# Patient Record
Sex: Male | Born: 1985 | Race: White | Hispanic: No | Marital: Single | Smoking: Former smoker
Health system: Southern US, Community
[De-identification: ages and names within clinical notes are randomized; demographics above are authoritative.]

## PROBLEM LIST (undated history)

## (undated) DIAGNOSIS — I370 Nonrheumatic pulmonary valve stenosis: Secondary | ICD-10-CM

## (undated) HISTORY — PX: WRIST SURGERY: SHX841

---

## 2007-04-11 ENCOUNTER — Emergency Department (HOSPITAL_COMMUNITY): Admission: EM | Admit: 2007-04-11 | Discharge: 2007-04-11 | Payer: Self-pay | Admitting: Emergency Medicine

## 2007-04-12 ENCOUNTER — Ambulatory Visit (HOSPITAL_COMMUNITY): Admission: RE | Admit: 2007-04-12 | Discharge: 2007-04-12 | Payer: Self-pay | Admitting: Orthopedic Surgery

## 2009-12-05 ENCOUNTER — Emergency Department (HOSPITAL_COMMUNITY): Admission: EM | Admit: 2009-12-05 | Discharge: 2009-12-05 | Payer: Self-pay | Admitting: Family Medicine

## 2010-12-17 NOTE — Consult Note (Signed)
NAME:  ASENCION, LOVEDAY NO.:  [id number]   MEDICAL RECORD NO.:  [id number]          PATIENT TYPE:  EMS   LOCATION:  ED                           FACILITY:  [facility]   PHYSICIAN:  Artist Pais. Weingold, M.D.DATE OF BIRTH:  Dec 04, 1985   DATE OF CONSULTATION:  04/11/2007  DATE OF DISCHARGE:                                 CONSULTATION   REASON FOR CONSULTATION:  Mr. Loken is a 25 year old right hand dominant  male who works at [employer]. Was getting off at work, riding his bike,  and feel onto his outstretched non-dominant left wrist. Presents today  with pain, swelling, and deformity. He is 25 years old. He is right hand  dominant.   ALLERGIES:  NO KNOWN DRUG ALLERGIES.   MEDICATIONS:  He is taking Adoral 30 mg daily.   PAST MEDICAL HISTORY:  Pulmonary hypertension, which he takes antibiotic  prophylaxis for dental procedures, which is stable. No other past  medical or surgical history.   SOCIAL HISTORY:  Noncontributory.   PHYSICAL EXAMINATION:  GENERAL:  A well nourished male. Pleasant, alert.  EXTREMITIES:  He is swollen and tender over his left wrist. He has a  minor abrasion on the ulnar side. He has no other significant finding of  the right or left upper extremities.   LABORATORY DATA:  X-rays shows a trans-radial styloid, perilunate  dislocation left wrist.   EMERGENCY DEPARTMENT COURSE:  He was given a 2% plain Lidocaine hematoma  block. He was then placed in finger-trap traction 15 pounds. Closed  reduction was performed. Portable  x-ray films showed reduction. He was placed in a well padded Sugar-tong  splint.   DISPOSITION:  He was discharged with Percocet for pain and instructions  on compartment syndrome. He is to call my office Monday morning. He is  to be NPO. We will try to fit him on the schedule either Monday or  possibly Wednesday or Friday this week for internal fixation. I  explained to him in great detail, the nature of the perilunate  dislocation. He will need fixation of his distal radius fracture as well  as repair of the ligaments  volarly and dorsally, as well as pinning of  the scapholunate and __________ ligament. Again, he was discharged with  Percocet for pain and he will call my office Monday morning to set up  surgery for that day or earlier this week.      Artist Pais Mina Marble, M.D.  Electronically Signed     MAW/MEDQ  D:  04/11/2007  T:  04/11/2007  Job:  [id number]

## 2010-12-17 NOTE — Op Note (Signed)
NAME:  Gary Gonzales, Gary Gonzales                ACCOUNT NO.:  [id number]   MEDICAL RECORD NO.:  [id number]          PATIENT TYPE:  AMB   LOCATION:  SDS                          FACILITY:  [facility]   PHYSICIAN:  Artist Pais. Weingold, M.D.DATE OF BIRTH:  1986-05-10   DATE OF PROCEDURE:  04/12/2007  DATE OF DISCHARGE:  04/12/2007                               OPERATIVE REPORT   PREOPERATIVE DIAGNOSIS:  Trans-styloid perilunate fracture dislocation,  left wrist.   POSTOPERATIVE DIAGNOSIS:  Trans-styloid perilunate fracture dislocation,  left wrist.   PROCEDURE:  Open reduction internal fixation distal radius fracture and  fixation of perilunate dislocation to both dorsal and volar approaches  with pinning of scapholunate and lunotriquetral ligaments, direct repair  of scapholunate interosseous ligament.   SURGEON:  Artist Pais. Mina Marble, M.D.   ASSISTANT:  None.   ANESTHESIA:  General.   TOURNIQUET TIME:  2 hours and 1 minute.   No complication.  No drains.   OPERATIVE REPORT:  The patient was taken to operating suite after the  induction of adequate general anesthesia.  The left upper extremity was  prepped and draped in a sterile fashion.  Esmarch was used to  exsanguinate the limb.  Tourniquet was inflated to 250 mmHg.  At this  point in time, an incision was made over the palmar aspect of the left  hand through the thenar crease in Brunner fashion across the wrist, out  the proximal forearm, over the flexor carpi radialis tendon.  Skin was  incised.  Flaps were raised accordingly.  The median nerve was  identified proximal to the carpal tunnel.  The carpal tunnel was  released.  There was copious amounts of blood in the carpal tunnel.  The  contents of the carpal tunnel were carefully retracted.  Once this was  done, a large transverse rent in the volar capsule was encountered.  The  lunate had been dislocated.  The lunate was in its proper position.  The  wound was then irrigated.  The rent  in the volar capsule was repaired  with 2-0 FiberWire suture in 6 separate baseball-type stitch sutures.  Once this was done, the interval between the flexor carpi radialis and  radial artery was developed.  Dissection was carried down to the  pronator quadratus.  This was carefully subperiosteally stripped off  distal radius.  Reduction was performed with a Therapist, nutritional, carefully  elevating a depressed articular fragment.  It was then fixed with DVR  left short standard plate using standard techniques.  After this was  completed, intraoperative fluoroscopy revealed adequate reduction in  both the AP, lateral and oblique view.  The wounds were irrigated and  packed with saline-soaked 4x4s.  The hand was then fully pronated, a  midline incision was made dorsally, the fourth dorsal compartment was  carefully identified and opened, retracted.  There was a large rent in  the dorsal capsule overlying the scapholunate interosseous ligament.  There was a direct avulsion of the scapholunate ligament off of the  lunate,  still attached to the scaphoid.  This was also irrigated of  clot.  After this was done under fluoroscopic and direct vision, the  scapholunate and lunotriquetral ligaments were pinned using of 045 K-  wires x2.  The K-wires were placed percutaneously and again driven from  the scaphoid into lunate and triquetrum into lunate.  Intraoperative  fluoroscopy revealed near anatomic alignment in both the AP, lateral and  oblique views.  The K-wires cut outside the skin and bent upon  themselves.  After this was completed, the scapholunate ligament  repaired directly using 2-0 FiberWire followed by a capsular imbrication  dorsally using the 2-0 FiberWire as well.  After this was completed, the  wound was irrigated.  The dorsal wound was closed in layers of a 3-0  undyed Vicryl for repair of the retinaculum and 4-0 Vicryl Rapide on the  skin.  The palmar wounds were repaired using the  2-0 undyed Vicryl to  repair the pronator quadratus, followed by a 4-0 Vicryl Rapide on the  skin.  Xeroform, 4x4s fluffs and a sugar-tong splint were applied.  The  patient also had 0.25% plain Marcaine injected in the wound for  postoperative pain control.  The patient tolerated the procedure well  and went to recovery room in stable fashion.      Artist Pais Mina Marble, M.D.  Electronically Signed     MAW/MEDQ  D:  04/12/2007  T:  04/13/2007  Job:  [id number]

## 2011-05-16 LAB — CBC
HCT: 43
Hemoglobin: 14.9
MCHC: 34.8
MCV: 89.3
Platelets: 256
RBC: 4.82
RDW: 12.6
WBC: 9.2

## 2013-09-25 ENCOUNTER — Emergency Department (INDEPENDENT_AMBULATORY_CARE_PROVIDER_SITE_OTHER)
Admission: EM | Admit: 2013-09-25 | Discharge: 2013-09-25 | Disposition: A | Discharge status: Home / Self Care | Payer: Self-pay | Source: Home / Self Care | Attending: Family Medicine | Admitting: Family Medicine

## 2013-09-25 ENCOUNTER — Encounter (HOSPITAL_COMMUNITY): Payer: Self-pay | Admitting: Emergency Medicine

## 2013-09-25 DIAGNOSIS — J329 Chronic sinusitis, unspecified: Secondary | ICD-10-CM

## 2013-09-25 DIAGNOSIS — J029 Acute pharyngitis, unspecified: Secondary | ICD-10-CM

## 2013-09-25 MED ORDER — AMOXICILLIN-POT CLAVULANATE 875-125 MG PO TABS: 1.0000 | ORAL_TABLET | Freq: Two times a day (BID) | ORAL | Status: DC

## 2013-09-25 MED ORDER — IPRATROPIUM BROMIDE 0.06 % NA SOLN
2.0000 | Freq: Four times a day (QID) | NASAL | Status: DC
Start: 2013-09-25 — End: 2021-12-02

## 2013-09-25 MED ORDER — PREDNISONE 20 MG PO TABS: 40.0000 mg | ORAL_TABLET | Freq: Every day | ORAL | Status: DC

## 2013-09-25 NOTE — ED Notes (Signed)
C/o sinus pressure and pain.  Nasal congestion.  Runny nose.   Sore throat.  Denies fever and any other symptoms.  Symptoms present since yesterday.

## 2013-09-25 NOTE — Discharge Instructions (Signed)
Thank you for coming in today. Take Augmentin and prednisone. Use Atrovent nasal spray. Is up to 2 Aleve twice daily for pain. Followup with your primary care provider as needed. Call or go to the emergency room if you get worse, have trouble breathing, have chest pains, or palpitations.     Pharyngitis Pharyngitis is redness, pain, and swelling (inflammation) of your pharynx.  CAUSES  Pharyngitis is usually caused by infection. Most of the time, these infections are from viruses (viral) and are part of a cold. However, sometimes pharyngitis is caused by bacteria (bacterial). Pharyngitis can also be caused by allergies. Viral pharyngitis may be spread from person to person by coughing, sneezing, and personal items or utensils (cups, forks, spoons, toothbrushes). Bacterial pharyngitis may be spread from person to person by more intimate contact, such as kissing.  SIGNS AND SYMPTOMS  Symptoms of pharyngitis include:   Sore throat.   Tiredness (fatigue).   Low-grade fever.   Headache.  Joint pain and muscle aches.  Skin rashes.  Swollen lymph nodes.  Plaque-like film on throat or tonsils (often seen with bacterial pharyngitis). DIAGNOSIS  Your health care provider will ask you questions about your illness and your symptoms. Your medical history, along with a physical exam, is often all that is needed to diagnose pharyngitis. Sometimes, a rapid strep test is done. Other lab tests may also be done, depending on the suspected cause.  TREATMENT  Viral pharyngitis will usually get better in 3 4 days without the use of medicine. Bacterial pharyngitis is treated with medicines that kill germs (antibiotics).  HOME CARE INSTRUCTIONS   Drink enough water and fluids to keep your urine clear or pale yellow.   Only take over-the-counter or prescription medicines as directed by your health care provider:   If you are prescribed antibiotics, make sure you finish them even if you start to  feel better.   Do not take aspirin.   Get lots of rest.   Gargle with 8 oz of salt water ( tsp of salt per 1 qt of water) as often as every 1 2 hours to soothe your throat.   Throat lozenges (if you are not at risk for choking) or sprays may be used to soothe your throat. SEEK MEDICAL CARE IF:   You have large, tender lumps in your neck.  You have a rash.  You cough up green, yellow-brown, or bloody spit. SEEK IMMEDIATE MEDICAL CARE IF:   Your neck becomes stiff.  You drool or are unable to swallow liquids.  You vomit or are unable to keep medicines or liquids down.  You have severe pain that does not go away with the use of recommended medicines.  You have trouble breathing (not caused by a stuffy nose). MAKE SURE YOU:   Understand these instructions.  Will watch your condition.  Will get help right away if you are not doing well or get worse. Document Released: 07/21/2005 Document Revised: 05/11/2013 Document Reviewed: 03/28/2013 [facility] Patient Information 2014 [place], [place].   Sinusitis Sinusitis is redness, soreness, and swelling (inflammation) of the paranasal sinuses. Paranasal sinuses are air pockets within the bones of your face (beneath the eyes, the middle of the forehead, or above the eyes). In healthy paranasal sinuses, mucus is able to drain out, and air is able to circulate through them by way of your nose. However, when your paranasal sinuses are inflamed, mucus and air can become trapped. This can allow bacteria and other germs to grow and  cause infection. Sinusitis can develop quickly and last only a short time (acute) or continue over a long period (chronic). Sinusitis that lasts for more than 12 weeks is considered chronic.  CAUSES  Causes of sinusitis include:  Allergies.  Structural abnormalities, such as displacement of the cartilage that separates your nostrils (deviated septum), which can decrease the air flow through your nose and  sinuses and affect sinus drainage.  Functional abnormalities, such as when the small hairs (cilia) that line your sinuses and help remove mucus do not work properly or are not present. SYMPTOMS  Symptoms of acute and chronic sinusitis are the same. The primary symptoms are pain and pressure around the affected sinuses. Other symptoms include:  Upper toothache.  Earache.  Headache.  Bad breath.  Decreased sense of smell and taste.  A cough, which worsens when you are lying flat.  Fatigue.  Fever.  Thick drainage from your nose, which often is green and may contain pus (purulent).  Swelling and warmth over the affected sinuses. DIAGNOSIS  Your caregiver will perform a physical exam. During the exam, your caregiver may:  Look in your nose for signs of abnormal growths in your nostrils (nasal polyps).  Tap over the affected sinus to check for signs of infection.  View the inside of your sinuses (endoscopy) with a special imaging device with a light attached (endoscope), which is inserted into your sinuses. If your caregiver suspects that you have chronic sinusitis, one or more of the following tests may be recommended:  Allergy tests.  Nasal culture A sample of mucus is taken from your nose and sent to a lab and screened for bacteria.  Nasal cytology A sample of mucus is taken from your nose and examined by your caregiver to determine if your sinusitis is related to an allergy. TREATMENT  Most cases of acute sinusitis are related to a viral infection and will resolve on their own within 10 days. Sometimes medicines are prescribed to help relieve symptoms (pain medicine, decongestants, nasal steroid sprays, or saline sprays).  However, for sinusitis related to a bacterial infection, your caregiver will prescribe antibiotic medicines. These are medicines that will help kill the bacteria causing the infection.  Rarely, sinusitis is caused by a fungal infection. In theses cases,  your caregiver will prescribe antifungal medicine. For some cases of chronic sinusitis, surgery is needed. Generally, these are cases in which sinusitis recurs more than 3 times per year, despite other treatments. HOME CARE INSTRUCTIONS   Drink plenty of water. Water helps thin the mucus so your sinuses can drain more easily.  Use a humidifier.  Inhale steam 3 to 4 times a day (for example, sit in the bathroom with the shower running).  Apply a warm, moist washcloth to your face 3 to 4 times a day, or as directed by your caregiver.  Use saline nasal sprays to help moisten and clean your sinuses.  Take over-the-counter or prescription medicines for pain, discomfort, or fever only as directed by your caregiver. SEEK IMMEDIATE MEDICAL CARE IF:  You have increasing pain or severe headaches.  You have nausea, vomiting, or drowsiness.  You have swelling around your face.  You have vision problems.  You have a stiff neck.  You have difficulty breathing. MAKE SURE YOU:   Understand these instructions.  Will watch your condition.  Will get help right away if you are not doing well or get worse. Document Released: 07/21/2005 Document Revised: 10/13/2011 Document Reviewed: 08/05/2011 [employer] Patient Information  2014 [employer]. ° °

## 2013-09-25 NOTE — ED Provider Notes (Signed)
Gary Gonzales is a 28 y.o. male who presents to Urgent Care today for sinus congestion and sore throat present for the last 2 days. Patient notes significant nasal discharge. He also notes significant congestion. He denies any fevers or chills nausea vomiting or diarrhea. He denies trouble breathing. He's tried some Mucinex which helped a little   History reviewed. No pertinent past medical history. History  Substance Use Topics  . Smoking status: Current Some Day Smoker  . Smokeless tobacco: Not on file  . Alcohol Use: No   ROS as above Medications: No current facility-administered medications for this encounter.   Current Outpatient Prescriptions  Medication Sig Dispense Refill  . amoxicillin-clavulanate (AUGMENTIN) 875-125 MG per tablet Take 1 tablet by mouth every 12 (twelve) hours.  14 tablet  0  . ipratropium (ATROVENT) 0.06 % nasal spray Place 2 sprays into both nostrils 4 (four) times daily.  15 mL  1  . predniSONE (DELTASONE) 20 MG tablet Take 2 tablets (40 mg total) by mouth daily.  10 tablet  0    Exam:  BP 142/82  Pulse 63  Temp(Src) 98.1 F (36.7 C) (Oral)  Resp 16  SpO2 97% Gen: Well NAD HEENT: EOMI,  MMM red nose with significant congestion and yellowish discharge. Tender palpation mastoid sinuses bilaterally. Posterior pharynx with cobblestoning and tympanic membranes are normal bilaterally. Lungs: Normal work of breathing. CTABL Heart: RRR no MRG Abd: NABS, Soft. NT, ND Exts: Brisk capillary refill, warm and well perfused.    Assessment and Plan: 28 y.o. male with sinusitis and pharyngitis. Plan to treat with Augmentin and prednisone and Atrovent nasal spray. Continue over-the-counter NSAIDs as needed for pain. Work note provided.  Discussed warning signs or symptoms. Please see discharge instructions. Patient expresses understanding.    Gary BongEvan S Keandrea Tapley, MD 09/25/13 [phone]

## 2018-10-10 ENCOUNTER — Emergency Department (HOSPITAL_COMMUNITY)
Admission: EM | Admit: 2018-10-10 | Discharge: 2018-10-10 | Disposition: A | Discharge status: Home / Self Care | Payer: BLUE CROSS/BLUE SHIELD | Attending: Emergency Medicine | Admitting: Emergency Medicine

## 2018-10-10 ENCOUNTER — Encounter (HOSPITAL_COMMUNITY): Payer: Self-pay | Admitting: Emergency Medicine

## 2018-10-10 DIAGNOSIS — F17200 Nicotine dependence, unspecified, uncomplicated: Secondary | ICD-10-CM | POA: Diagnosis not present

## 2018-10-10 DIAGNOSIS — R112 Nausea with vomiting, unspecified: Secondary | ICD-10-CM | POA: Diagnosis not present

## 2018-10-10 LAB — COMPREHENSIVE METABOLIC PANEL
ALT: 28 U/L (ref 0–44)
AST: 30 U/L (ref 15–41)
Albumin: 4.7 g/dL (ref 3.5–5.0)
Alkaline Phosphatase: 42 U/L (ref 38–126)
Anion gap: 12 (ref 5–15)
BUN: 21 mg/dL — AB (ref 6–20)
CALCIUM: 9.8 mg/dL (ref 8.9–10.3)
CHLORIDE: 105 mmol/L (ref 98–111)
CO2: 21 mmol/L — AB (ref 22–32)
CREATININE: 1.12 mg/dL (ref 0.61–1.24)
GFR calc Af Amer: >60 mL/min (ref >60)
GFR calc non Af Amer: >60 mL/min (ref >60)
GLUCOSE: 138 mg/dL — AB (ref 70–99)
Potassium: 3.5 mmol/L (ref 3.5–5.1)
SODIUM: 138 mmol/L (ref 135–145)
Total Bilirubin: 1.2 mg/dL (ref 0.3–1.2)
Total Protein: 7.3 g/dL (ref 6.5–8.1)

## 2018-10-10 LAB — URINALYSIS, ROUTINE W REFLEX MICROSCOPIC
BACTERIA UA: NONE SEEN
Bilirubin Urine: NEGATIVE
Glucose, UA: NEGATIVE mg/dL
Hgb urine dipstick: NEGATIVE
KETONES UR: 80 mg/dL — AB
LEUKOCYTE UA: NEGATIVE
Nitrite: NEGATIVE
PH: 6 (ref 5.0–8.0)
Protein, ur: 30 mg/dL — AB
Specific Gravity, Urine: 1.035 — ABNORMAL HIGH (ref 1.005–1.030)

## 2018-10-10 LAB — CBC
HEMATOCRIT: 45.8 % (ref 39.0–52.0)
Hemoglobin: 15.8 g/dL (ref 13.0–17.0)
MCH: 29.8 pg (ref 26.0–34.0)
MCHC: 34.5 g/dL (ref 30.0–36.0)
MCV: 86.4 fL (ref 80.0–100.0)
PLATELETS: 316 10*3/uL (ref 150–400)
RBC: 5.3 MIL/uL (ref 4.22–5.81)
RDW: 12 % (ref 11.5–15.5)
WBC: 17.9 10*3/uL — ABNORMAL HIGH (ref 4.0–10.5)
nRBC: 0 % (ref 0.0–0.2)

## 2018-10-10 LAB — LIPASE, BLOOD: LIPASE: 34 U/L (ref 11–51)

## 2018-10-10 MED ORDER — HALOPERIDOL LACTATE 5 MG/ML IJ SOLN
2.0000 mg | Freq: Once | INTRAMUSCULAR | Status: AC
  Administered 2018-10-10: 2 mg via INTRAVENOUS
  Filled 2018-10-10: qty 1

## 2018-10-10 MED ORDER — DICYCLOMINE HCL 10 MG/ML IM SOLN
20.0000 mg | Freq: Once | INTRAMUSCULAR | Status: AC
  Administered 2018-10-10: 20 mg via INTRAMUSCULAR
  Filled 2018-10-10: qty 2

## 2018-10-10 MED ORDER — SODIUM CHLORIDE 0.9 % IV BOLUS
1000.0000 mL | Freq: Once | INTRAVENOUS | Status: AC
  Administered 2018-10-10: 1000 mL via INTRAVENOUS

## 2018-10-10 MED ORDER — SODIUM CHLORIDE 0.9% FLUSH
3.0000 mL | Freq: Once | INTRAVENOUS | Status: AC
  Administered 2018-10-10: 3 mL via INTRAVENOUS

## 2018-10-10 NOTE — ED Provider Notes (Signed)
[facility] EMERGENCY DEPARTMENT Provider Note   CSN: [id number] Arrival date & time: 10/10/18  0941    History   Chief Complaint Chief Complaint  Patient presents with  . Emesis    HPI Gary Gonzales is a 33 y.o. male.     The history is provided by the patient and medical records. No language interpreter was used.  Emesis  Associated symptoms: diarrhea   Associated symptoms: no abdominal pain    Gary Gonzales is a 33 y.o. male with no known PMH who presents to the Emergency Department complaining of nausea and vomiting.  Patient states that this began 2 days ago.  He was seen at the urgent care where he got a nausea injection that made him feel better.  He was given a prescription for Zofran ODT which he took yesterday and today and felt it was helping.  This morning, he awoke and was feeling nauseous again.  Tried ODT Zofran this morning and it did not help.  He reports one episode of emesis, but mostly feels as if he is dry heaving.  He has had one episode of loose, nonbloody stool this morning which is new as well.  He denies any fever or chills.  He is not having any abdominal pain, back pain or urinary symptoms.  He does state that he smokes marijuana quite regularly.  He has never had this issue before.  He smoked the day his symptoms began, but not yesterday throughout the day.  Last night, since he was feeling better, he did have an edible and reports his symptoms all beginning again this morning.  He is not sure if this is related or not.  No known sick contacts.  No recent travel.  No suspicious food intake.  History reviewed. No pertinent past medical history.  There are no active problems to display for this patient.   History reviewed. No pertinent surgical history.      Home Medications    Prior to Admission medications   Medication Sig Start Date End Date Taking? Authorizing Provider  amoxicillin-clavulanate (AUGMENTIN) 875-125 MG per tablet  Take 1 tablet by mouth every 12 (twelve) hours. 09/25/13   Rodolph Bong, MD  ipratropium (ATROVENT) 0.06 % nasal spray Place 2 sprays into both nostrils 4 (four) times daily. 09/25/13   Rodolph Bong, MD  predniSONE (DELTASONE) 20 MG tablet Take 2 tablets (40 mg total) by mouth daily. 09/25/13   Rodolph Bong, MD    Family History No family history on file.  Social History Social History   Tobacco Use  . Smoking status: Current Some Day Smoker  Substance Use Topics  . Alcohol use: No  . Drug use: Yes    Types: Marijuana     Allergies   Patient has no known allergies.   Review of Systems Review of Systems  Gastrointestinal: Positive for diarrhea, nausea and vomiting. Negative for abdominal pain, blood in stool and constipation.  All other systems reviewed and are negative.    Physical Exam Updated Vital Signs BP (!) 154/109   Pulse 65   Temp (!) 97.3 F (36.3 C) (Oral)   Resp 18   SpO2 100%   Physical Exam Vitals signs and nursing note reviewed.  Constitutional:      General: He is not in acute distress.    Appearance: He is well-developed.     Comments: Non-toxic appearing.  HENT:     Head: Normocephalic and atraumatic.  Neck:     Musculoskeletal: Neck supple.  Cardiovascular:     Rate and Rhythm: Normal rate and regular rhythm.     Heart sounds: Normal heart sounds. No murmur.  Pulmonary:     Effort: Pulmonary effort is normal. No respiratory distress.     Breath sounds: Normal breath sounds.  Abdominal:     General: There is no distension.     Palpations: Abdomen is soft.     Comments: No abdominal tenderness.  Skin:    General: Skin is warm and dry.  Neurological:     Mental Status: He is alert and oriented to person, place, and time.      ED Treatments / Results  Labs (all labs ordered are listed, but only abnormal results are displayed) Labs Reviewed  COMPREHENSIVE METABOLIC PANEL - Abnormal; Notable for the following components:      Result  Value   CO2 21 (*)    Glucose, Bld 138 (*)    BUN 21 (*)    All other components within normal limits  CBC - Abnormal; Notable for the following components:   WBC 17.9 (*)    All other components within normal limits  URINALYSIS, ROUTINE W REFLEX MICROSCOPIC - Abnormal; Notable for the following components:   Specific Gravity, Urine 1.035 (*)    Ketones, ur 80 (*)    Protein, ur 30 (*)    All other components within normal limits  LIPASE, BLOOD    EKG None  Radiology No results found.  Procedures Procedures (including critical care time)  Medications Ordered in ED Medications  sodium chloride flush (NS) 0.9 % injection 3 mL (3 mLs Intravenous Given 10/10/18 1051)  sodium chloride 0.9 % bolus 1,000 mL (0 mLs Intravenous Stopped 10/10/18 1133)  haloperidol lactate (HALDOL) injection 2 mg (2 mg Intravenous Given 10/10/18 1048)  dicyclomine (BENTYL) injection 20 mg (20 mg Intramuscular Given 10/10/18 1048)     Initial Impression / Assessment and Plan / ED Course  I have reviewed the triage vital signs and the nursing notes.  Pertinent labs & imaging results that were available during my care of the patient were reviewed by me and considered in my medical decision making (see chart for details).       Gary Gonzales is a 33 y.o. male who presents to ED for nausea and vomiting x 3 days.  He was seen at urgent care at initial symptom onset, given nausea injection in clinic with improvement and ODT Zofran for home. He felt much better until this morning when symptoms flared up again.  On initial examination, patient was afebrile, hemodynamically stable with benign abdominal exam.  He reports nausea and vomiting, but no abdominal pain.  UA without signs of infection, but is concerning for dehydration.  CBC with leukocytosis of 17.9.  Also has glucose of 138.  No history of diabetes.  Strongly encouraged him to follow-up with a doctor for repeat fasting sugars and possibly A1c if PCP felt  this was necessary.  He was given fluid bolus, Bentyl and 2 mg of Haldol.  On my reevaluation, patient feels very much improved.  His nausea has resolved.  He has eaten several saltine crackers and drinking water without any difficulty.  He has had no further emesis.  Repeat abdominal exam benign.  We did discuss his elevated white blood cell count and that he should have a low threshold to return to the emergency department should he develop abdominal pain, fever, persistent  uncontrolled vomiting, new or worsening symptoms or any additional concerns.  Patient verbalized understanding of return precautions, PCP follow-up and home care instructions.  He was discharged home in satisfactory condition and all questions were answered.   Final Clinical Impressions(s) / ED Diagnoses   Final diagnoses:  Non-intractable vomiting with nausea, unspecified vomiting type    ED Discharge Orders    None       Sofie Schendel, Chase PicketJaime Pilcher, PA-C 10/10/18 1254    Shaune PollackIsaacs, Cameron, MD 10/15/18 [phone]

## 2018-10-10 NOTE — ED Notes (Signed)
Pt ambulated to restroom to give urine sample.

## 2018-10-10 NOTE — ED Notes (Signed)
Pt states he has not smoked marijuana in 3 days, male companion states she gave him an edible last night to help with his nausea, pt has not been able to keep anything down since yesterday. PA notified, will continue to monitor.

## 2018-10-10 NOTE — ED Triage Notes (Signed)
Patient reports that he began vomiting Friday, went to urgent care and got an injection that made him feel better. This am he began feeling bad again and having diarrhea and nausea. He reports he has zofran odt that is not helping.

## 2018-10-10 NOTE — Discharge Instructions (Signed)
It was my pleasure taking care of you today!   Use nausea medication prescribed by urgent care as needed for nausea/vomiting.  Please follow up with your primary care doctor for further evaluation. If you do not have one, please see the information below.   It is VERY important that you monitor your symptoms and return to the Emergency Department if you develop any of the following symptoms:  You develop abdominal pain You have a fever.  You keep throwing up and can't keep fluids down. You pass bloody or black tarry stools.  There is bright red blood in the stool. You do not seem to be getting better.  You have any questions or concerns.    To find a primary care or specialty doctor please call [phone] or [phone] to access "[place] Find a Doctor Service."  You may also go on the [facility] website at [email or web address]  There are also multiple [facility] practices throughout the [place] that are frequently accepting new patients. You may find a clinic that is close to your home and contact them.  [facility] - [address] [place] [address] [phone]  [facility] in [place] (also locations in [place] and [place]) - [address] [employer] [place] [phone]  [facility] Department - [address] [place] [phone]

## 2018-10-10 NOTE — ED Notes (Signed)
Patient verbalizes understanding of discharge instructions. Opportunity for questioning and answers were provided. Armband removed by staff, pt discharged from ED ambulatory.   

## 2019-06-08 ENCOUNTER — Other Ambulatory Visit: Payer: Self-pay

## 2019-06-08 DIAGNOSIS — Z20822 Contact with and (suspected) exposure to covid-19: Secondary | ICD-10-CM

## 2019-06-09 LAB — NOVEL CORONAVIRUS, NAA: SARS-CoV-2, NAA: NOT DETECTED

## 2019-07-13 ENCOUNTER — Emergency Department (HOSPITAL_COMMUNITY): Payer: BLUE CROSS/BLUE SHIELD

## 2019-07-13 ENCOUNTER — Other Ambulatory Visit: Payer: Self-pay

## 2019-07-13 ENCOUNTER — Emergency Department (HOSPITAL_COMMUNITY)
Admission: EM | Admit: 2019-07-13 | Discharge: 2019-07-13 | Disposition: A | Discharge status: Home / Self Care | Payer: BLUE CROSS/BLUE SHIELD | Attending: Emergency Medicine | Admitting: Emergency Medicine

## 2019-07-13 DIAGNOSIS — Z79899 Other long term (current) drug therapy: Secondary | ICD-10-CM | POA: Diagnosis not present

## 2019-07-13 DIAGNOSIS — F17210 Nicotine dependence, cigarettes, uncomplicated: Secondary | ICD-10-CM | POA: Diagnosis not present

## 2019-07-13 DIAGNOSIS — R55 Syncope and collapse: Secondary | ICD-10-CM | POA: Insufficient documentation

## 2019-07-13 DIAGNOSIS — R202 Paresthesia of skin: Secondary | ICD-10-CM | POA: Diagnosis not present

## 2019-07-13 DIAGNOSIS — R200 Anesthesia of skin: Secondary | ICD-10-CM | POA: Diagnosis present

## 2019-07-13 LAB — I-STAT CHEM 8, ED
BUN: 12 mg/dL (ref 6–20)
Calcium, Ion: 1.19 mmol/L (ref 1.15–1.40)
Chloride: 101 mmol/L (ref 98–111)
Creatinine, Ser: 1.1 mg/dL (ref 0.61–1.24)
Glucose, Bld: 108 mg/dL — ABNORMAL HIGH (ref 70–99)
HCT: 44 % (ref 39.0–52.0)
Hemoglobin: 15 g/dL (ref 13.0–17.0)
Potassium: 4.2 mmol/L (ref 3.5–5.1)
Sodium: 141 mmol/L (ref 135–145)
TCO2: 27 mmol/L (ref 22–32)

## 2019-07-13 LAB — COMPREHENSIVE METABOLIC PANEL
ALT: 29 U/L (ref 0–44)
AST: 24 U/L (ref 15–41)
Albumin: 4.8 g/dL (ref 3.5–5.0)
Alkaline Phosphatase: 44 U/L (ref 38–126)
Anion gap: 11 (ref 5–15)
BUN: 10 mg/dL (ref 6–20)
CO2: 28 mmol/L (ref 22–32)
Calcium: 10 mg/dL (ref 8.9–10.3)
Chloride: 102 mmol/L (ref 98–111)
Creatinine, Ser: 1.09 mg/dL (ref 0.61–1.24)
GFR calc Af Amer: >60 mL/min (ref >60)
GFR calc non Af Amer: >60 mL/min (ref >60)
Glucose, Bld: 108 mg/dL — ABNORMAL HIGH (ref 70–99)
Potassium: 4.3 mmol/L (ref 3.5–5.1)
Sodium: 141 mmol/L (ref 135–145)
Total Bilirubin: 0.4 mg/dL (ref 0.3–1.2)
Total Protein: 7.5 g/dL (ref 6.5–8.1)

## 2019-07-13 LAB — DIFFERENTIAL
Abs Immature Granulocytes: 0.02 10*3/uL (ref 0.00–0.07)
Basophils Absolute: 0 10*3/uL (ref 0.0–0.1)
Basophils Relative: 0 %
Eosinophils Absolute: 0.2 10*3/uL (ref 0.0–0.5)
Eosinophils Relative: 3 %
Immature Granulocytes: 0 %
Lymphocytes Relative: 31 %
Lymphs Abs: 2.4 10*3/uL (ref 0.7–4.0)
Monocytes Absolute: 0.5 10*3/uL (ref 0.1–1.0)
Monocytes Relative: 7 %
Neutro Abs: 4.5 10*3/uL (ref 1.7–7.7)
Neutrophils Relative %: 59 %

## 2019-07-13 LAB — CBC
HCT: 45.7 % (ref 39.0–52.0)
Hemoglobin: 16 g/dL (ref 13.0–17.0)
MCH: 29.6 pg (ref 26.0–34.0)
MCHC: 35 g/dL (ref 30.0–36.0)
MCV: 84.6 fL (ref 80.0–100.0)
Platelets: 320 10*3/uL (ref 150–400)
RBC: 5.4 MIL/uL (ref 4.22–5.81)
RDW: 11.8 % (ref 11.5–15.5)
WBC: 7.5 10*3/uL (ref 4.0–10.5)
nRBC: 0 % (ref 0.0–0.2)

## 2019-07-13 LAB — ETHANOL: Alcohol, Ethyl (B): <10 mg/dL (ref <10)

## 2019-07-13 LAB — APTT: aPTT: 28 seconds (ref 24–36)

## 2019-07-13 LAB — PROTIME-INR
INR: 1 (ref 0.8–1.2)
Prothrombin Time: 13.1 seconds (ref 11.4–15.2)

## 2019-07-13 LAB — TROPONIN I (HIGH SENSITIVITY): Troponin I (High Sensitivity): 3 ng/L (ref <18)

## 2019-07-13 LAB — BRAIN NATRIURETIC PEPTIDE: B Natriuretic Peptide: 10 pg/mL (ref 0.0–100.0)

## 2019-07-13 NOTE — ED Triage Notes (Signed)
Pt arrived to triage with numbness/heaviness in the left arm. Code stroke activated in triage. Pt hypertensive and reporting 2 episodes of dizziness/diaphoresis. LKW 1600 today

## 2019-07-13 NOTE — Consult Note (Signed)
Neurology Consultation Reason for Consult: Left arm numbness Referring Physician: Trifan, M  CC: Left arm numbness  History is obtained from: Patient  HPI: Gary Gonzales is a 33 y.o. male with a history of "CHF" though I am not sure how this diagnosis was made as he has not had an echo who presents with left arm numbness and tingling that started around 4 PM.  He states that on Monday he had about a 15-minute episode which subsequently resolved.  Today, however, around 4:15 PM he began having left arm numbness and tingling.  He indicates the underside of his left upper arm as being the predominant area, sometimes extending down to the dorsum of his hand.  He denies any weakness or neck pain.  He denies any headache or history of headaches.   LKW: 4 PM tpa given?: no, mild symptoms  ROS: A 14 point ROS was performed and is negative except as noted in the HPI.   Past medical history: ?  CHF   Family history: Heart disease  Social History:  reports that he has been smoking. He does not have any smokeless tobacco history on file. He reports current drug use. Drug: Marijuana. He reports that he does not drink alcohol.   Exam: Current vital signs: BP (!) 152/102 (BP Location: Right Arm)   Pulse 83   Temp 98.6 F (37 C) (Oral)   Resp 13   SpO2 99%  Vital signs in last 24 hours: Temp:  [98.6 F (37 C)] 98.6 F (37 C) (12/09 1900) Pulse Rate:  [83-92] 83 (12/09 1929) Resp:  [13-18] 13 (12/09 1929) BP: (144-152)/(102-104) 152/102 (12/09 1929) SpO2:  [99 %] 99 % (12/09 1929)   Physical Exam  Constitutional: Appears well-developed and well-nourished.  Psych: Affect appropriate to situation Eyes: No scleral injection HENT: No OP obstrucion MSK: no joint deformities.  Cardiovascular: Normal rate and regular rhythm.  Respiratory: Effort normal, non-labored breathing GI: Soft.  No distension. There is no tenderness.  Skin: WDI  Neuro: Mental Status: Patient is awake, alert,  oriented to person, place, month, year, and situation. Patient is able to give a clear and coherent history. No signs of aphasia or neglect Cranial Nerves: II: Visual Fields are full. Pupils are equal, round, and reactive to light.   III,IV, VI: EOMI without ptosis or diploplia.  V: Facial sensation is symmetric to temperature VII: Facial movement is symmetric.  VIII: hearing is intact to voice X: Uvula elevates symmetrically XI: Shoulder shrug is symmetric. XII: tongue is midline without atrophy or fasciculations.  Motor: Tone is normal. Bulk is normal. 5/5 strength was present in all four extremities.  Sensory: Sensation is symmetric to light touch and pinprick in the arms and legs.  I did an extensive pinprick evaluation over the left arm looking for patchy areas of numbness without any significant change for the patient. Deep Tendon Reflexes: 2+ and symmetric in the biceps and brachioradialis Cerebellar: FNF and HKS are intact bilaterally    I have reviewed labs in epic and the results pertinent to this consultation are: Chem-8-unremarkable  I have reviewed the images obtained: CT head-unremarkable  Impression: 33 year old male with subjective left arm paresthesias.  Possibilities include complicated migraine, cervical radiculopathy, and given a questionable history of CHF, stroke.  I do think that further imaging with an MRI of his brain and cervical spine would be useful to rule out serious etiologies, but if these are negative, then I would favor following up with outpatient  neurology.  Recommendations: 1) MRI brain and cervical spine without contrast 2) if negative can follow-up with outpatient neurology for consideration of EMG/NCS.   Roland Rack, MD [facility] [phone]  If 7pm- 7am, please page neurology on call as listed in [place].

## 2019-07-13 NOTE — ED Notes (Signed)
Patient transported to MRI 

## 2019-07-13 NOTE — ED Provider Notes (Signed)
[facility] EMERGENCY DEPARTMENT Provider Note   CSN: [id number] Arrival date & time: 07/13/19  3267  An emergency department physician performed an initial assessment on this suspected stroke patient at 49.  History   Chief Complaint Chief Complaint  Patient presents with   Numbness    HPI Gary Gonzales is a 33 y.o. male presenting for evaluation of numbness and tingling.  Patient states 2 days ago he had a episode of tingling of the medial aspect of his left arm, mostly in the upper arm.  This lasted for several minutes before resolving without intervention.  Today at 4 PM he had similar occurrence.  He came to the ER because later tonight he had an episode where he felt flushed and lightheaded, almost presyncopal.  This lasted for several seconds before resolving.  He has had continued numbness and tingling of the left medial arm.  He denies headache, vision changes, slurred speech, neck pain, chest pain, shortness of breath, nausea, vomiting, abdominal pain, urinary symptoms, normal bowel movements.  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  Patient states he has been told he has CHF, however never has had an echo and is not on any diuretics. He takes no medications daily.      HPI  No past medical history on file.  There are no active problems to display for this patient.   No past surgical history on file.      Home Medications    Prior to Admission medications   Medication Sig Start Date End Date Taking? Authorizing Provider  ibuprofen (ADVIL) 200 MG tablet Take 200 mg by mouth every 6 (six) hours as needed for mild pain.   Yes [provider]  amoxicillin-clavulanate (AUGMENTIN) 875-125 MG per tablet Take 1 tablet by mouth every 12 (twelve) hours. 09/25/13   Gregor Hams, MD  ipratropium (ATROVENT) 0.06 % nasal spray Place 2 sprays into both nostrils 4 (four) times  daily. 09/25/13   Gregor Hams, MD  predniSONE (DELTASONE) 20 MG tablet Take 2 tablets (40 mg total) by mouth daily. 09/25/13   Gregor Hams, MD    Family History No family history on file.  Social History Social History   Tobacco Use   Smoking status: Current Some Day Smoker  Substance Use Topics   Alcohol use: No   Drug use: Yes    Types: Marijuana     Allergies   Lactose intolerance (gi)   Review of Systems Review of Systems  Neurological: Positive for numbness (and tingling). Syncope: presyncope.  All other systems reviewed and are negative.    Physical Exam Updated Vital Signs BP 135/88    Pulse 62    Temp 98.6 F (37 C) (Oral)    Resp 11    Ht 5\' 6"  (1.676 m)    Wt 72.6 kg    SpO2 99%    BMI 25.82 kg/m   Physical Exam Vitals signs and nursing note reviewed.  Constitutional:      General: He is not in acute distress.    Appearance: He is well-developed.  HENT:     Head: Normocephalic and atraumatic.  Eyes:     Conjunctiva/sclera: Conjunctivae normal.     Pupils: Pupils are equal, round, and reactive to light.  Neck:     Musculoskeletal: Normal range of motion and neck supple.     Comments: No tenderness  palpation midline C-spine.  No step-offs or deformities. Cardiovascular:     Rate and Rhythm: Normal rate and regular rhythm.     Pulses: Normal pulses.  Pulmonary:     Effort: Pulmonary effort is normal. No respiratory distress.     Breath sounds: Normal breath sounds. No wheezing.  Abdominal:     General: Bowel sounds are normal. There is no distension.     Palpations: Abdomen is soft.     Tenderness: There is no abdominal tenderness.  Musculoskeletal: Normal range of motion.  Skin:    General: Skin is warm and dry.     Capillary Refill: Capillary refill takes less than 2 seconds.  Neurological:     General: No focal deficit present.     Mental Status: He is alert and oriented to person, place, and time.     GCS: GCS eye subscore is 4. GCS  verbal subscore is 5. GCS motor subscore is 6.     Cranial Nerves: Cranial nerves are intact.     Sensory: Sensation is intact.     Motor: Motor function is intact.     Coordination: Coordination is intact.     Comments: Patient denies decreased sensation on exam of bilateral upper extremities.  Radial pulses intact bilaterally.  Grip strength intact bilaterally.  Fine movement and coronation intact.      ED Treatments / Results  Labs (all labs ordered are listed, but only abnormal results are displayed) Labs Reviewed  COMPREHENSIVE METABOLIC PANEL - Abnormal; Notable for the following components:      Result Value   Glucose, Bld 108 (*)    All other components within normal limits  I-STAT CHEM 8, ED - Abnormal; Notable for the following components:   Glucose, Bld 108 (*)    All other components within normal limits  ETHANOL  PROTIME-INR  APTT  CBC  DIFFERENTIAL  BRAIN NATRIURETIC PEPTIDE  TROPONIN I (HIGH SENSITIVITY)    EKG None  Radiology Dg Chest 2 View  Result Date: 07/13/2019 CLINICAL DATA:  Chest pain, history of near syncopal episode EXAM: CHEST - 2 VIEW COMPARISON:  None. FINDINGS: The heart size and mediastinal contours are within normal limits. Both lungs are hyperinflated but clear. The visualized skeletal structures are unremarkable. IMPRESSION: Hyperinflation likely related to a vigorous inspiratory effort. No focal infiltrate is seen. Electronically Signed   By: Alcide Clever M.D.   On: 07/13/2019 20:36   Mr Brain Wo Contrast  Result Date: 07/13/2019 CLINICAL DATA:  Left arm numbness and tingling EXAM: MRI HEAD WITHOUT CONTRAST MRI CERVICAL SPINE WITHOUT CONTRAST TECHNIQUE: Multiplanar, multiecho pulse sequences of the brain and surrounding structures, and cervical spine, to include the craniocervical junction and cervicothoracic junction, were obtained without intravenous contrast. COMPARISON:  None. FINDINGS: MRI HEAD FINDINGS Brain: There is no acute infarct,  acute hemorrhage or extra-axial collection. The white matter signal is normal for the patient's age. The cerebral and cerebellar volume are age-appropriate. There is no hydrocephalus. Blood-sensitive sequences show no chronic microhemorrhage or superficial siderosis. The midline structures are normal. Vascular: Normal flow voids. Skull and upper cervical spine: Normal marrow signal. Sinuses/Orbits: Negative. Other: None. MRI CERVICAL SPINE FINDINGS Alignment: Physiologic. Vertebrae: No fracture, evidence of discitis, or bone lesion. Cord: Normal signal and morphology. Posterior Fossa, vertebral arteries, paraspinal tissues: Negative. Disc levels: C1-C2: Normal. C2-C3: Normal disc space and facets. No spinal canal or neuroforaminal stenosis. C3-C4: Small left uncovertebral spur mildly narrows the left neural foramen. C4-C5: Normal disc space  and facets. No spinal canal or neuroforaminal stenosis. C5-C6: Normal disc space and facets. No spinal canal or neuroforaminal stenosis. C6-C7: Normal disc space and facets. No spinal canal or neuroforaminal stenosis. C7-T1: Normal disc space and facets. No spinal canal or neuroforaminal stenosis. IMPRESSION: 1. Normal brain. 2. Mild left C4 foraminal stenosis. Electronically Signed   By: Deatra RobinsonKevin  Herman M.D.   On: 07/13/2019 22:48   Mr Cervical Spine Wo Contrast  Result Date: 07/13/2019 CLINICAL DATA:  Left arm numbness and tingling EXAM: MRI HEAD WITHOUT CONTRAST MRI CERVICAL SPINE WITHOUT CONTRAST TECHNIQUE: Multiplanar, multiecho pulse sequences of the brain and surrounding structures, and cervical spine, to include the craniocervical junction and cervicothoracic junction, were obtained without intravenous contrast. COMPARISON:  None. FINDINGS: MRI HEAD FINDINGS Brain: There is no acute infarct, acute hemorrhage or extra-axial collection. The white matter signal is normal for the patient's age. The cerebral and cerebellar volume are age-appropriate. There is no  hydrocephalus. Blood-sensitive sequences show no chronic microhemorrhage or superficial siderosis. The midline structures are normal. Vascular: Normal flow voids. Skull and upper cervical spine: Normal marrow signal. Sinuses/Orbits: Negative. Other: None. MRI CERVICAL SPINE FINDINGS Alignment: Physiologic. Vertebrae: No fracture, evidence of discitis, or bone lesion. Cord: Normal signal and morphology. Posterior Fossa, vertebral arteries, paraspinal tissues: Negative. Disc levels: C1-C2: Normal. C2-C3: Normal disc space and facets. No spinal canal or neuroforaminal stenosis. C3-C4: Small left uncovertebral spur mildly narrows the left neural foramen. C4-C5: Normal disc space and facets. No spinal canal or neuroforaminal stenosis. C5-C6: Normal disc space and facets. No spinal canal or neuroforaminal stenosis. C6-C7: Normal disc space and facets. No spinal canal or neuroforaminal stenosis. C7-T1: Normal disc space and facets. No spinal canal or neuroforaminal stenosis. IMPRESSION: 1. Normal brain. 2. Mild left C4 foraminal stenosis. Electronically Signed   By: Deatra RobinsonKevin  Herman M.D.   On: 07/13/2019 22:48   Ct Head Code Stroke Wo Contrast  Result Date: 07/13/2019 CLINICAL DATA:  Code stroke.  Left arm heaviness and numbness EXAM: CT HEAD WITHOUT CONTRAST TECHNIQUE: Contiguous axial images were obtained from the base of the skull through the vertex without intravenous contrast. COMPARISON:  None. FINDINGS: Brain: No evidence of acute infarction, hemorrhage, hydrocephalus, extra-axial collection or mass lesion/mass effect. Vascular: Negative for hyperdense vessel Skull: Negative Sinuses/Orbits: Mild mucosal edema paranasal sinuses. Negative orbit Other: None ASPECTS (Alberta Stroke Program Early CT Score) - Ganglionic level infarction (caudate, lentiform nuclei, internal capsule, insula, M1-M3 cortex): 7 - Supraganglionic infarction (M4-M6 cortex): 3 Total score (0-10 with 10 being normal): 10 IMPRESSION: 1. Normal CT  head 2. ASPECTS is 10 Electronically Signed   By: Marlan Palauharles  Clark M.D.   On: 07/13/2019 19:25    Procedures Procedures (including critical care time)  Medications Ordered in ED Medications - No data to display   Initial Impression / Assessment and Plan / ED Course  I have reviewed the triage vital signs and the nursing notes.  Pertinent labs & imaging results that were available during my care of the patient were reviewed by me and considered in my medical decision making (see chart for details).  Clinical Course as of Jul 13 2339  Wed Jul 13, 2019  2120 Patient was seen by myself as well as PA provider.  Briefly is a 33 year old male with no significant past medical history presents ED with complaint of left arm tingling and lightheadedness.  Patient reports he has had an episode once in the past where he feels like the lateral aspect of his  left arm from the shoulder down to his fingers became numb, at times including the entire hand.  This resolved spontaneously.  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  He continues to have mild very mild symptoms in his arm but the lightheadedness is improved.  He expresses concern for possible cardiac etiology, explaining his significant family history of heart disease in his father, and that he himself is a former smoker but now quit.  He denies any history of stroke.  He denies any spinal or neck manipulation.  Initially brought in for stroke evaluation.  His CT imaging is negative.  The neurologist recommended MR imaging of the brain and also the cervical spine.  His EKG and troponin are benign.  Have a very low suspicion for acute coronary syndrome at this time.  Plan to follow-up on his imaging, if benign I anticipate he can be discharged home.  Discussed the importance of establishing a PCP given his cardiac risk factors in particular, he is at higher risk of  developing early heart disease, and he understands this.   [MT]    Clinical Course User Index [MT] Terald Sleeper, MD       Pt presenting for evaluation of L arm tingling in the setting of feeling flushed and possible presyncopal.  Code stroke was activated upon arrival. Neuro evaluated the pt. Low suspicion for stroke, but neuro recommends MRI head and neck. Due to pt's vague description of history of cardiac problems, will also obtain troponin, EKG, chest x-ray, and BNP.  Labs reassuring.  Hemoglobin stable.  Electrolytes stable.  BNP normal.  Troponin normal.  CT head negative for acute findings.  Chest x-ray viewed interpreted by me, no pneumonia, pneumothorax, cardiomegaly.  EKG without STEMI.  MRI negative for acute findings.  Shows mild C4 cervical stenosis.  Discussed findings with patient.  At this time, patient appears safe for discharge.  Return precautions given.  Patient states he understands and agrees to plan.   Final Clinical Impressions(s) / ED Diagnoses   Final diagnoses:  Paresthesia    ED Discharge Orders    None       Alveria Apley, PA-C 07/13/19 2344    Terald Sleeper, MD 07/14/19 1353

## 2019-07-13 NOTE — Discharge Instructions (Addendum)
Call the number in the paperwork to help set up primary care.  Call the office listed below to set up cardiology primary care.  Return to the ER with any new, worsening, or concerning symptoms.

## 2019-08-31 ENCOUNTER — Other Ambulatory Visit: Payer: Self-pay

## 2019-08-31 ENCOUNTER — Ambulatory Visit (HOSPITAL_COMMUNITY)
Admission: EM | Admit: 2019-08-31 | Discharge: 2019-08-31 | Disposition: A | Discharge status: Home / Self Care | Payer: BLUE CROSS/BLUE SHIELD | Attending: Urgent Care | Admitting: Urgent Care

## 2019-08-31 ENCOUNTER — Encounter (HOSPITAL_COMMUNITY): Payer: Self-pay

## 2019-08-31 DIAGNOSIS — K5900 Constipation, unspecified: Secondary | ICD-10-CM

## 2019-08-31 DIAGNOSIS — K529 Noninfective gastroenteritis and colitis, unspecified: Secondary | ICD-10-CM | POA: Diagnosis not present

## 2019-08-31 DIAGNOSIS — R197 Diarrhea, unspecified: Secondary | ICD-10-CM

## 2019-08-31 MED ORDER — DOCUSATE SODIUM 100 MG PO CAPS: 100.0000 mg | ORAL_CAPSULE | Freq: Two times a day (BID) | ORAL | 0 refills | Status: DC

## 2019-08-31 NOTE — Discharge Instructions (Addendum)
Make sure you push fluids drinking mostly water but mix it with Gatorade.  Try to eat light meals including soups, broths and soft foods, fruits.  You may use Zofran for your nausea and vomiting once every 8 hours.   In the future if you have any constipation, I recommend you use a stool softener such as docusate (Colace) 100mg  twice daily for at least 1 week. If you still have trouble with constipation, then try an enema. Make sure you maintain your healthy diet, eat plenty fiber, stay active, hydrate with at least 2 liters of water daily.

## 2019-08-31 NOTE — ED Provider Notes (Signed)
[facility]   MRN: [id number] DOB: 11-13-1985  Subjective:   Gary Gonzales is a 34 y.o. male presenting for 1 week hx of intermittent diarrhea, loose stools. This persisted for ~3 days then was followed by constipation. Patient then started using laxative. Now has persistent intermittent abdominal cramping, has more loose stools. Patient tries to eat fiber, vegetables. Hydrates well. Denies fever, n/v, bloody stools. Does admit that he thinks he passed a kidney stone ~10 years ago.   No current facility-administered medications for this encounter.  Current Outpatient Medications:  .  ibuprofen (ADVIL) 200 MG tablet, Take 200 mg by mouth every 6 (six) hours as needed for mild pain., Disp: , Rfl:  .  ipratropium (ATROVENT) 0.06 % nasal spray, Place 2 sprays into both nostrils 4 (four) times daily., Disp: 15 mL, Rfl: 1   Allergies  Allergen Reactions  . Lactose Intolerance (Gi)     Hives  "tears insides up"   Denies pmh.    Left wrist surgery for fracture of same place.  History reviewed. No pertinent family history.  Social History   Tobacco Use  . Smoking status: Current Some Day Smoker  . Smokeless tobacco: Never Used  Substance Use Topics  . Alcohol use: No  . Drug use: Not Currently    Types: Marijuana    ROS   Objective:   Vitals: BP 132/80 (BP Location: Left Arm)   Pulse 84   Temp 98.2 F (36.8 C) (Oral)   Resp 18   Wt 165 lb (74.8 kg)   SpO2 100%   BMI 26.63 kg/m   Physical Exam Constitutional:      General: He is not in acute distress.    Appearance: Normal appearance. He is well-developed. He is not ill-appearing, toxic-appearing or diaphoretic.  HENT:     Head: Normocephalic and atraumatic.     Right Ear: External ear normal.     Left Ear: External ear normal.     Nose: Nose normal.     Mouth/Throat:     Mouth: Mucous membranes are moist.     Pharynx: Oropharynx is clear.  Eyes:     General: No scleral icterus.    Extraocular  Movements: Extraocular movements intact.     Pupils: Pupils are equal, round, and reactive to light.  Cardiovascular:     Rate and Rhythm: Normal rate and regular rhythm.     Heart sounds: Normal heart sounds. No murmur. No friction rub. No gallop.   Pulmonary:     Effort: Pulmonary effort is normal. No respiratory distress.     Breath sounds: Normal breath sounds. No stridor. No wheezing, rhonchi or rales.  Abdominal:     General: Bowel sounds are normal. There is no distension.     Palpations: Abdomen is soft. There is no mass.     Tenderness: There is no abdominal tenderness. There is no guarding or rebound.  Skin:    General: Skin is warm and dry.  Neurological:     Mental Status: He is alert and oriented to person, place, and time.  Psychiatric:        Mood and Affect: Mood normal.        Behavior: Behavior normal.        Thought Content: Thought content normal.        Judgment: Judgment normal.     Assessment and Plan :   1. Gastroenteritis   2. Diarrhea, unspecified type   3. Constipation, unspecified constipation  type     Recommended patient stop using laxatives and go back to his regular healthy diet.  Suspect that patient had gastroenteritis and by his own admission started eating really heavy and unhealthy foods too soon.  I will prescribe patient docusate should he experience constipation in the future. Counseled patient on potential for adverse effects with medications prescribed/recommended today, ER and return-to-clinic precautions discussed, patient verbalized understanding.    Jaynee [place], [place] 08/31/19 [phone]

## 2019-08-31 NOTE — ED Triage Notes (Signed)
Pt state he has had diarrhea x 2 days, constipation and abdominal pain x 1 week.

## 2020-07-27 ENCOUNTER — Other Ambulatory Visit: Payer: Self-pay

## 2020-07-27 ENCOUNTER — Ambulatory Visit (HOSPITAL_COMMUNITY)
Admission: EM | Admit: 2020-07-27 | Discharge: 2020-07-27 | Disposition: A | Discharge status: Home / Self Care | Payer: BLUE CROSS/BLUE SHIELD | Attending: Family Medicine | Admitting: Family Medicine

## 2020-07-27 ENCOUNTER — Encounter (HOSPITAL_COMMUNITY): Payer: Self-pay | Admitting: Emergency Medicine

## 2020-07-27 DIAGNOSIS — H5789 Other specified disorders of eye and adnexa: Secondary | ICD-10-CM

## 2020-07-27 MED ORDER — TOBRAMYCIN 0.3 % OP SOLN: 1.0000 [drp] | OPHTHALMIC | 0 refills | Status: DC

## 2020-07-27 MED ORDER — TETRACAINE HCL 0.5 % OP SOLN
OPHTHALMIC | Status: AC
  Filled 2020-07-27: qty 4

## 2020-07-27 NOTE — Discharge Instructions (Addendum)
WARM COMPRESSES USE EYE DROPS RETURN AS NEEDED

## 2020-07-27 NOTE — ED Triage Notes (Signed)
Pt c/o left eye irritation onset yesterday. Pt states he had some eye discharge this morning. Pt states that eye is painful when looking a round. Pt states he has some redness in the corner. Pt states he did use a warm compress this morning with no relief.

## 2020-07-27 NOTE — ED Provider Notes (Signed)
[facility]    CSN: [id number] Arrival date & time: 07/27/20  9323      History   Chief Complaint Chief Complaint  Patient presents with  . Eye Problem    HPI Gary Gonzales is a 34 y.o. male.   HPI  Eye redness, itchiness, and yellow discharge since yesterday.  He used a warm compress this morning.  No cold symptoms or runny nose.  No known exposure to illness.  No trauma.  No change in vision  History reviewed. No pertinent past medical history.  There are no problems to display for this patient.   History reviewed. No pertinent surgical history.     Home Medications    Prior to Admission medications   Medication Sig Start Date End Date Taking? Authorizing Provider  docusate sodium (COLACE) 100 MG capsule Take 1 capsule (100 mg total) by mouth every 12 (twelve) hours. 08/31/19   Wallis Bamberg, PA-C  ibuprofen (ADVIL) 200 MG tablet Take 200 mg by mouth every 6 (six) hours as needed for mild pain.    [provider]  ipratropium (ATROVENT) 0.06 % nasal spray Place 2 sprays into both nostrils 4 (four) times daily. 09/25/13   Rodolph Bong, MD  tobramycin (TOBREX) 0.3 % ophthalmic solution Place 1 drop into the left eye every 4 (four) hours. 07/27/20   Eustace Moore, MD    Family History History reviewed. No pertinent family history.  Social History Social History   Tobacco Use  . Smoking status: Former Games developer  . Smokeless tobacco: Never Used  Vaping Use  . Vaping Use: Every day  Substance Use Topics  . Alcohol use: No  . Drug use: Not Currently    Types: Marijuana     Allergies   Lactose, Lactose intolerance (gi), and Docusate sodium   Review of Systems Review of Systems  See HPI Physical Exam Triage Vital Signs ED Triage Vitals  Enc Vitals Group     BP 07/27/20 1112 122/83     Pulse Rate 07/27/20 1112 78     Resp 07/27/20 1112 18     Temp 07/27/20 1112 100 F (37.8 C)     Temp Source 07/27/20 1112 Oral     SpO2  07/27/20 1112 100 %     Weight --      Height --      Head Circumference --      Peak Flow --      Pain Score 07/27/20 1108 1     Pain Loc --      Pain Edu? --      Excl. in GC? --    No data found.  Updated Vital Signs BP 122/83 (BP Location: Left Arm)   Pulse 78   Temp 100 F (37.8 C) (Oral)   Resp 18   SpO2 100%   Visual Acuity Right Eye Distance: 20/20 Left Eye Distance: 20/25 Bilateral Distance: 20/20     Physical Exam Constitutional:      General: He is not in acute distress.    Appearance: He is well-developed, normal weight and well-nourished.  HENT:     Head: Normocephalic and atraumatic.     Mouth/Throat:     Mouth: Oropharynx is clear and moist.  Eyes:     General:        Right eye: No discharge.        Left eye: No discharge.     Pupils: Pupils are equal, round, and reactive  to light.     Comments: Left eye has some mild lateral conjunctival injection.  No discharge.  No foreign body.  No suspected abrasion  Cardiovascular:     Rate and Rhythm: Normal rate.  Pulmonary:     Effort: Pulmonary effort is normal. No respiratory distress.  Abdominal:     General: There is no distension.     Palpations: Abdomen is soft.  Musculoskeletal:        General: No edema. Normal range of motion.     Cervical back: Normal range of motion.  Skin:    General: Skin is warm and dry.  Neurological:     Mental Status: He is alert.      UC Treatments / Results  Labs (all labs ordered are listed, but only abnormal results are displayed) Labs Reviewed - No data to display  EKG   Radiology No results found.  Procedures Procedures (including critical care time)  Medications Ordered in UC Medications - No data to display  Initial Impression / Assessment and Plan / UC Course  I have reviewed the triage vital signs and the nursing notes.  Pertinent labs & imaging results that were available during my care of the patient were reviewed by me and considered in  my medical decision making (see chart for details).     Final Clinical Impressions(s) / UC Diagnoses   Final diagnoses:  Eye irritation     Discharge Instructions     WARM COMPRESSES USE EYE DROPS RETURN AS NEEDED   ED Prescriptions    Medication Sig Dispense Auth. Provider   tobramycin (TOBREX) 0.3 % ophthalmic solution Place 1 drop into the left eye every 4 (four) hours. 5 mL Eustace Moore, MD     PDMP not reviewed this encounter.   Eustace Moore, MD 07/27/20 1341

## 2021-03-31 ENCOUNTER — Emergency Department (HOSPITAL_COMMUNITY)
Admission: EM | Admit: 2021-03-31 | Discharge: 2021-03-31 | Disposition: A | Discharge status: Home / Self Care | Payer: BLUE CROSS/BLUE SHIELD | Attending: Emergency Medicine | Admitting: Emergency Medicine

## 2021-03-31 ENCOUNTER — Encounter (HOSPITAL_COMMUNITY): Payer: Self-pay | Admitting: Emergency Medicine

## 2021-03-31 ENCOUNTER — Other Ambulatory Visit: Payer: Self-pay

## 2021-03-31 DIAGNOSIS — Z87891 Personal history of nicotine dependence: Secondary | ICD-10-CM | POA: Diagnosis not present

## 2021-03-31 DIAGNOSIS — L509 Urticaria, unspecified: Secondary | ICD-10-CM | POA: Diagnosis not present

## 2021-03-31 DIAGNOSIS — T7840XA Allergy, unspecified, initial encounter: Secondary | ICD-10-CM | POA: Diagnosis not present

## 2021-03-31 DIAGNOSIS — T63461A Toxic effect of venom of wasps, accidental (unintentional), initial encounter: Secondary | ICD-10-CM | POA: Diagnosis not present

## 2021-03-31 DIAGNOSIS — T63481A Toxic effect of venom of other arthropod, accidental (unintentional), initial encounter: Secondary | ICD-10-CM

## 2021-03-31 MED ORDER — DIPHENHYDRAMINE HCL 25 MG PO TABS: 25.0000 mg | ORAL_TABLET | Freq: Four times a day (QID) | ORAL | 0 refills | Status: DC | PRN

## 2021-03-31 MED ORDER — EPINEPHRINE 0.3 MG/0.3ML IJ SOAJ: 0.3000 mg | INTRAMUSCULAR | 0 refills | Status: DC | PRN

## 2021-03-31 MED ORDER — FAMOTIDINE 20 MG PO TABS: 20.0000 mg | ORAL_TABLET | Freq: Two times a day (BID) | ORAL | 0 refills | Status: DC

## 2021-03-31 MED ORDER — PREDNISONE 10 MG PO TABS: 20.0000 mg | ORAL_TABLET | Freq: Every day | ORAL | 0 refills | Status: DC

## 2021-03-31 MED ORDER — EPINEPHRINE 0.3 MG/0.3ML IJ SOAJ: 0.3000 mg | INTRAMUSCULAR | 0 refills | Status: AC | PRN

## 2021-03-31 NOTE — ED Provider Notes (Signed)
[facility] EMERGENCY DEPARTMENT Provider Note   CSN: [id number] Arrival date & time: 03/31/21  1945     History Chief Complaint  Patient presents with   Allergic Reaction    Gary Gonzales is a 35 y.o. male with past medical history who presents for evaluation of allergic reaction. Stung by yellow jacket to left thigh. Felt woozy and mild Sob at initial onset however none currently. Sx hours PTA. No current symptoms other than some itching to left thigh.  No facial swelling, redness, sensation of throat closing. Denies additional aggrieving or alleviating factors. No pain.   History obtained from patient and past medical records. No interpretor was used.   HPI     History reviewed. No pertinent past medical history.  There are no problems to display for this patient.   History reviewed. No pertinent surgical history.     No family history on file.  Social History   Tobacco Use   Smoking status: Former   Smokeless tobacco: Never  Building services engineer Use: Every day  Substance Use Topics   Alcohol use: No   Drug use: Not Currently    Types: Marijuana    Home Medications Prior to Admission medications   Medication Sig Start Date End Date Taking? Authorizing Provider  diphenhydrAMINE (BENADRYL) 25 MG tablet Take 1 tablet (25 mg total) by mouth every 6 (six) hours as needed. 03/31/21   Elisama Thissen A, PA-C  docusate sodium (COLACE) 100 MG capsule Take 1 capsule (100 mg total) by mouth every 12 (twelve) hours. 08/31/19   Wallis Bamberg, PA-C  EPINEPHrine 0.3 mg/0.3 mL IJ SOAJ injection Inject 0.3 mg into the muscle as needed for anaphylaxis. 03/31/21   Onetha Gaffey A, PA-C  famotidine (PEPCID) 20 MG tablet Take 1 tablet (20 mg total) by mouth 2 (two) times daily. 03/31/21   Bettymae Yott A, PA-C  ibuprofen (ADVIL) 200 MG tablet Take 200 mg by mouth every 6 (six) hours as needed for mild pain.    [provider]  ipratropium (ATROVENT)  0.06 % nasal spray Place 2 sprays into both nostrils 4 (four) times daily. 09/25/13   Rodolph Bong, MD  predniSONE (DELTASONE) 10 MG tablet Take 2 tablets (20 mg total) by mouth daily. 03/31/21   Kailin Leu A, PA-C  tobramycin (TOBREX) 0.3 % ophthalmic solution Place 1 drop into the left eye every 4 (four) hours. 07/27/20   Eustace Moore, MD    Allergies    Lactose, Lactose intolerance (gi), and Docusate sodium  Review of Systems   Review of Systems  Constitutional: Negative.   HENT: Negative.    Respiratory: Negative.    Cardiovascular: Negative.   Gastrointestinal: Negative.   Genitourinary: Negative.   Musculoskeletal: Negative.   Skin:  Positive for rash.  Neurological: Negative.   All other systems reviewed and are negative.  Physical Exam Updated Vital Signs BP (!) 141/86 (BP Location: Right Arm)   Pulse 62   Temp 98.2 F (36.8 C) (Oral)   Resp 16   SpO2 100%   Physical Exam Vitals and nursing note reviewed.  Constitutional:      General: He is not in acute distress.    Appearance: He is well-developed. He is not ill-appearing, toxic-appearing or diaphoretic.  HENT:     Head: Normocephalic and atraumatic.     Nose: Nose normal.     Mouth/Throat:     Mouth: Mucous membranes are moist.  Comments: PO clear. No edema, or lesions Eyes:     Pupils: Pupils are equal, round, and reactive to light.  Cardiovascular:     Rate and Rhythm: Normal rate and regular rhythm.     Pulses: Normal pulses.     Heart sounds: Normal heart sounds.  Pulmonary:     Effort: Pulmonary effort is normal. No respiratory distress.     Breath sounds: Normal breath sounds.  Abdominal:     General: Bowel sounds are normal. There is no distension.     Palpations: Abdomen is soft.     Tenderness: There is no abdominal tenderness.  Musculoskeletal:        General: No swelling, tenderness, deformity or signs of injury. Normal range of motion.     Cervical back: Normal range of  motion and neck supple.     Right lower leg: No edema.     Left lower leg: No edema.  Skin:    General: Skin is warm and dry.     Capillary Refill: Capillary refill takes less than 2 seconds.     Findings: Rash present.     Comments: Single urticaria to left inner thigh. No warmth, fluctuance or induration. No target lesions, bulla, vesicles, desquamated skin.  Neurological:     General: No focal deficit present.     Mental Status: He is alert and oriented to person, place, and time.    ED Results / Procedures / Treatments   Labs (all labs ordered are listed, but only abnormal results are displayed) Labs Reviewed - No data to display  EKG None  Radiology No results found.  Procedures Procedures   Medications Ordered in ED Medications - No data to display  ED Course  I have reviewed the triage vital signs and the nursing notes.  Pertinent labs & imaging results that were available during my care of the patient were reviewed by me and considered in my medical decision making (see chart for details).  Patient re-evaluated prior to dc, is hemodynamically stable, in no respiratory distress, and denies the feeling of throat closing. Pt has been advised to take OTC benadryl & return to the ED if they have a mod-severe allergic rxn (s/s including throat closing, difficulty breathing, swelling of lips face or tongue). Pt is to follow up with their PCP. Pt is agreeable with plan & verbalizes understanding.     MDM Rules/Calculators/A&P                            Final Clinical Impression(s) / ED Diagnoses Final diagnoses:  Allergic reaction, initial encounter  Insect stings, accidental or unintentional, initial encounter    Rx / DC Orders ED Discharge Orders          Ordered    diphenhydrAMINE (BENADRYL) 25 MG tablet  Every 6 hours PRN,   Status:  Discontinued        03/31/21 2100    predniSONE (DELTASONE) 10 MG tablet  Daily,   Status:  Discontinued        03/31/21 2100     EPINEPHrine 0.3 mg/0.3 mL IJ SOAJ injection  As needed,   Status:  Discontinued        03/31/21 2100    famotidine (PEPCID) 20 MG tablet  2 times daily,   Status:  Discontinued        03/31/21 2100    diphenhydrAMINE (BENADRYL) 25 MG tablet  Every 6 hours PRN  03/31/21 2106    EPINEPHrine 0.3 mg/0.3 mL IJ SOAJ injection  As needed        03/31/21 2106    famotidine (PEPCID) 20 MG tablet  2 times daily        03/31/21 2106    predniSONE (DELTASONE) 10 MG tablet  Daily        03/31/21 2106             Tyreek Clabo A, PA-C 03/31/21 2108    Terald Sleeper, MD 04/01/21 [phone]

## 2021-03-31 NOTE — Discharge Instructions (Addendum)
Take the medications as prescribed  Return for new or worsening symptoms 

## 2021-03-31 NOTE — ED Triage Notes (Signed)
Pt reports being stung by a yellow jacket on left leg.  Initially has itchy feet and palm and tightening throat.  Pt states symptoms have resolved.

## 2021-12-02 ENCOUNTER — Ambulatory Visit
Admission: RE | Admit: 2021-12-02 | Discharge: 2021-12-02 | Disposition: A | Discharge status: Home / Self Care | Payer: BLUE CROSS/BLUE SHIELD | Source: Ambulatory Visit | Attending: Emergency Medicine | Admitting: Emergency Medicine

## 2021-12-02 ENCOUNTER — Ambulatory Visit (INDEPENDENT_AMBULATORY_CARE_PROVIDER_SITE_OTHER): Payer: BLUE CROSS/BLUE SHIELD

## 2021-12-02 VITALS — BP 133/89 | HR 67 | Temp 98.1°F | Resp 18

## 2021-12-02 DIAGNOSIS — R0782 Intercostal pain: Secondary | ICD-10-CM

## 2021-12-02 DIAGNOSIS — S20221A Contusion of right back wall of thorax, initial encounter: Secondary | ICD-10-CM

## 2021-12-02 DIAGNOSIS — M940 Chondrocostal junction syndrome [Tietze]: Secondary | ICD-10-CM | POA: Diagnosis not present

## 2021-12-02 DIAGNOSIS — S299XXA Unspecified injury of thorax, initial encounter: Secondary | ICD-10-CM | POA: Diagnosis not present

## 2021-12-02 HISTORY — DX: Nonrheumatic pulmonary valve stenosis: I37.0

## 2021-12-02 MED ORDER — DICLOFENAC SODIUM 1 % EX GEL: 4.0000 g | Freq: Four times a day (QID) | CUTANEOUS | 2 refills | Status: AC

## 2021-12-02 NOTE — ED Provider Notes (Signed)
?[facility] ? ? ? ?CSN: [id number] ?Arrival date & time: 12/02/21  0815 ?  ? ?HISTORY  ? ?Chief Complaint  ?Patient presents with  ? Rib Injury  ? APPT 0830  ? ?HPI ?Gary Gonzales is a 36 y.o. male. Patient c/o LFT sided Rib Injury x 1 week.  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  Patient endorses pain has become progressively worst.  Patient endorses pain worsens with laying down.  Patient hasn't taken any medications for symptoms.  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  Patient denies difficulty breathing, taking a deep breath.  Vital signs are normal today.  Patient is well-appearing on arrival. ? ?The history is provided by the patient.  ?Past Medical History:  ?Diagnosis Date  ? Pulmonary stenosis   ? ?There are no problems to display for this patient. ? ?Past Surgical History:  ?Procedure Laterality Date  ? WRIST SURGERY Left   ? ? ?Home Medications   ? ?Prior to Admission medications   ?Medication Sig Start Date End Date Taking? Authorizing Provider  ?diphenhydrAMINE (BENADRYL) 25 MG tablet Take 1 tablet (25 mg total) by mouth every 6 (six) hours as needed. 03/31/21   Henderly, Britni A, PA-C  ?docusate sodium (COLACE) 100 MG capsule Take 1 capsule (100 mg total) by mouth every 12 (twelve) hours. 08/31/19   Wallis Bamberg, PA-C  ?EPINEPHrine 0.3 mg/0.3 mL IJ SOAJ injection Inject 0.3 mg into the muscle as needed for anaphylaxis. 03/31/21   Henderly, Britni A, PA-C  ?famotidine (PEPCID) 20 MG tablet Take 1 tablet (20 mg total) by mouth 2 (two) times daily. 03/31/21   Henderly, Britni A, PA-C  ?ibuprofen (ADVIL) 200 MG tablet Take 200 mg by mouth every 6 (six) hours as needed for mild pain.    [provider]  ?ipratropium (ATROVENT) 0.06 % nasal spray Place 2 sprays into both nostrils 4 (four) times daily. 09/25/13   Rodolph Bong, MD  ?predniSONE (DELTASONE) 10 MG tablet Take 2 tablets (20 mg total) by mouth daily. 03/31/21   Henderly, Britni A, PA-C  ?tobramycin (TOBREX) 0.3 % ophthalmic solution Place 1 drop into the left eye every 4 (four) hours. 07/27/20   Eustace Moore, MD  ? ? ?Family History ?History reviewed. No pertinent family history. ?Social History ?Social History  ? ?Tobacco Use  ? Smoking status: Former  ? Smokeless tobacco: Never  ?Vaping Use  ? Vaping Use: Every day  ? Substances: Nicotine  ?Substance Use Topics  ? Alcohol use: Yes  ? Drug use: Not Currently  ?  Types: Marijuana  ? ?Allergies   ?Lactose, Lactose intolerance (gi), and Docusate sodium ? ?Review of Systems ?Review of Systems ?Pertinent findings noted in history of present illness.  ? ?Physical Exam ?Triage Vital Signs ?ED Triage Vitals  ?Enc Vitals Group  ?   BP 05/31/21 0827 (!) 147/82  ?   Pulse Rate 05/31/21 0827 72  ?   Resp 05/31/21 0827 18  ?   Temp 05/31/21 0827 98.3 ?F (36.8 ?C)  ?   Temp Source 05/31/21 0827 Oral  ?   SpO2 05/31/21 0827 98 %  ?  Weight --   ?   Height --   ?   Head Circumference --   ?   Peak Flow --   ?   Pain Score 05/31/21 0826 5  ?   Pain Loc --   ?   Pain Edu? --   ?   Excl. in GC? --   ?No data found. ? ?Updated Vital Signs ?BP 133/89 (BP Location: Left Arm)   Pulse 67   Temp 98.1 ?F (36.7 ?C) (Oral)   Resp 18   SpO2 97%  ? ?Physical Exam ?Vitals and nursing note reviewed.  ?Constitutional:   ?   General: He is not in acute distress. ?   Appearance: Normal appearance. He is not ill-appearing.  ?HENT:  ?   Head: Normocephalic and atraumatic.  ?Eyes:  ?   General: Lids are normal.     ?   Right eye: No discharge.     ?   Left eye: No discharge.  ?   Extraocular Movements: Extraocular movements intact.  ?   Conjunctiva/sclera: Conjunctivae normal.  ?   Right eye: Right conjunctiva is not  injected.  ?   Left eye: Left conjunctiva is not injected.  ?Neck:  ?   Trachea: Trachea and phonation normal.  ?Cardiovascular:  ?   Rate and Rhythm: Normal rate and regular rhythm.  ?   Pulses: Normal pulses.  ?   Heart sounds: Normal heart sounds. No murmur heard. ?  No friction rub. No gallop.  ?Pulmonary:  ?   Effort: Pulmonary effort is normal. No accessory muscle usage, prolonged expiration or respiratory distress.  ?   Breath sounds: Normal breath sounds. No stridor, decreased air movement or transmitted upper airway sounds. No decreased breath sounds, wheezing, rhonchi or rales.  ?Chest:  ?   Chest wall: No tenderness.  ?Musculoskeletal:     ?   General: Normal range of motion.  ?   Cervical back: Normal range of motion and neck supple. Normal range of motion.  ?   Comments: Tenderness to palpation at insertion of left eighth, ninth and 10th ribs. ? ?Ecchymoses at right fourth rib immediately posterior to axillary line.  ?Lymphadenopathy:  ?   Cervical: No cervical adenopathy.  ?Skin: ?   General: Skin is warm and dry.  ?   Findings: No erythema or rash.  ?Neurological:  ?   General: No focal deficit present.  ?   Mental Status: He is alert and oriented to person, place, and time.  ?Psychiatric:     ?   Mood and Affect: Mood normal.     ?   Behavior: Behavior normal.  ? ? ?Visual Acuity ?Right Eye Distance:   ?Left Eye Distance:   ?Bilateral Distance:   ? ?Right Eye Near:   ?Left Eye Near:    ?Bilateral Near:    ? ?UC Couse / Diagnostics / Procedures:  ?  ?EKG ? ?Radiology ?DG Ribs Unilateral W/Chest Left ? ?Result Date: 12/02/2021 ?CLINICAL DATA:  Left rib injury 1 week ago post blunt trauma EXAM: LEFT RIBS AND CHEST - 3+ VIEW COMPARISON:  07/13/2019 FINDINGS: No fracture or other bone lesions are seen involving the ribs. There is no evidence of pneumothorax or pleural effusion. Both lungs are clear. Heart size and mediastinal contours are within normal limits. IMPRESSION: Negative. Electronically Signed    By: Corlis Leak  Hassell M.D.   On: 12/02/2021 09:13   ? ?Procedures ?Procedures (including critical care time) ? ?UC Diagnoses /  Final Clinical Impressions(s)   ?I have reviewed the triage vital signs and the nursing notes. ? ?Pertinent labs & imaging results that were available during my care of the patient were reviewed by me and considered in my medical decision making (see chart for details).   ? ?Final diagnoses:  ?Chest injury, initial encounter  ?Contusion of right back wall of thorax, initial encounter  ?Costochondritis  ? ?X-ray of left ribs was normal.  Per my personal read, right upper ribs are atraumatic as well.  Patient provided with a prescription for Voltaren gel and advised to follow-up in 2 weeks if no improvement.  Patient politely declined offer for prescription for ibuprofen 800 mg.  Return precautions advised. ? ?ED Prescriptions   ? ? Medication Sig Dispense Auth. Provider  ? diclofenac Sodium (VOLTAREN) 1 % GEL Apply 4 g topically 4 (four) times daily. Apply to affected areas 4 times daily as needed for pain. 100 g Theadora Rama Scales, PA-C  ? ?  ? ?PDMP not reviewed this encounter. ? ?Pending results:  ?Labs Reviewed - No data to display ? ?Medications Ordered in UC: ?Medications - No data to display ? ?Disposition Upon Discharge:  ?Condition: stable for discharge home ?Home: take medications as prescribed; routine discharge instructions as discussed; follow up as advised. ? ?Patient presented with an acute illness with associated systemic symptoms and significant discomfort requiring urgent management. In my opinion, this is a condition that a prudent lay person (someone who possesses an average knowledge of health and medicine) may potentially expect to result in complications if not addressed urgently such as respiratory distress, impairment of bodily function or dysfunction of bodily organs.  ? ?Routine symptom specific, illness specific and/or disease specific instructions were discussed with  the patient and/or caregiver at length.  ? ?As such, the patient has been evaluated and assessed, work-up was performed and treatment was provided in alignment with urgent care protocols and evidence base

## 2021-12-02 NOTE — Discharge Instructions (Signed)
The x-ray of your ribs and chest today did not show any acute bony injury.  There is also no concern about cardiopulmonary injury.  The bruising on the right side of her chest should resolve on its own after turning a multitude of colors. ? ?I sent a prescription for diclofenac gel to your pharmacy.  You can apply to any area that is painful up to 4 times daily as needed. ? ?While I do think that 1 weeks time is sufficient for fracture to appear on x-rays (sometimes they do not show up immediately after the injury) I do want to remind you that you are not feeling better in a few weeks, you may need repeat imaging. ? ?Thank you for visiting urgent care today.  We appreciate the opportunity to participate in your care. ?

## 2021-12-02 NOTE — ED Triage Notes (Signed)
Patient c/o LFT sided Rib Injury x 1 week.  ? 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  ? ?Patient endorses pain has become progressively worst.  ? ?Patient endorses pain worsens with laying down.  ? ?Patient hasn't taken any medications for symptoms.  ? ?

## 2022-05-27 ENCOUNTER — Ambulatory Visit (HOSPITAL_COMMUNITY): Payer: Self-pay

## 2022-12-06 IMAGING — DX DG RIBS W/ CHEST 3+V*L*
5 series · 5 of 5 positions shown · non-contrast
Comparison: 07/13/2019

CLINICAL DATA: Left rib injury 1 week ago post blunt trauma

EXAM:
LEFT RIBS AND CHEST - 3+ VIEW

[chest pa]
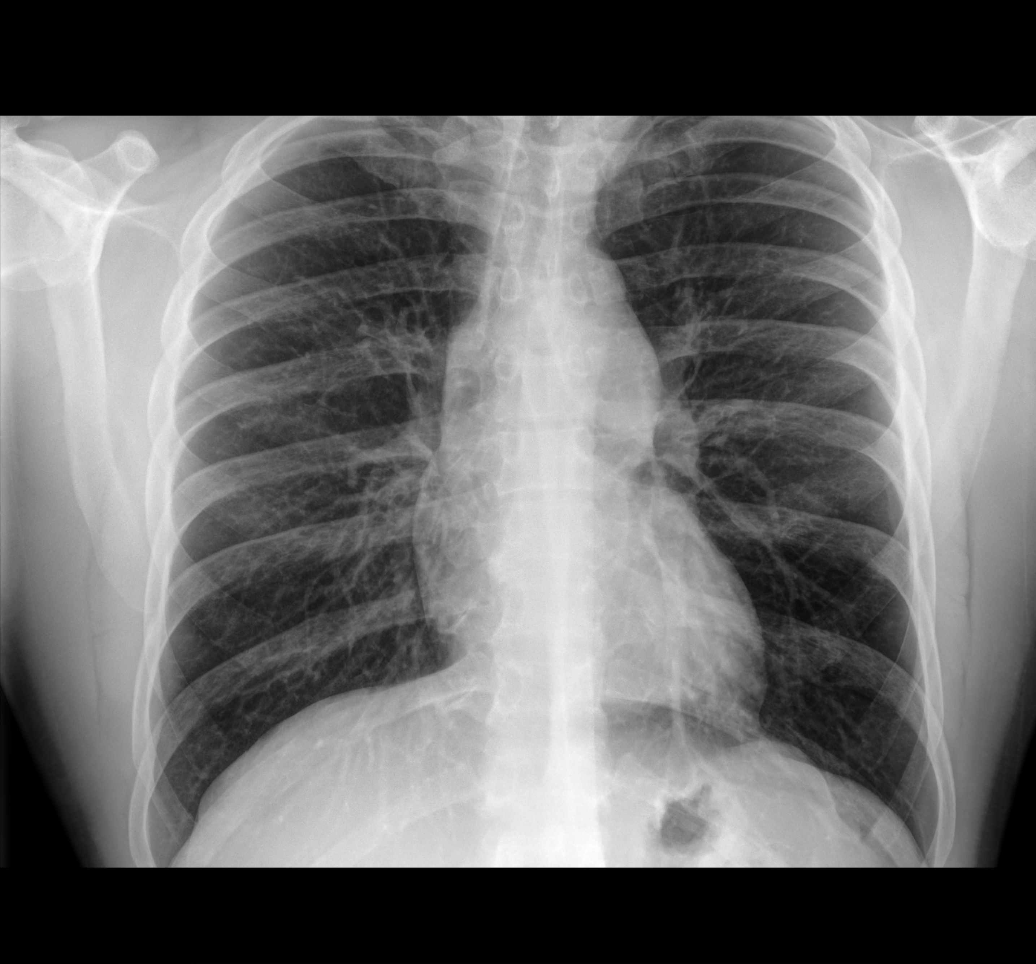

[ribs pa (1 of 4)]
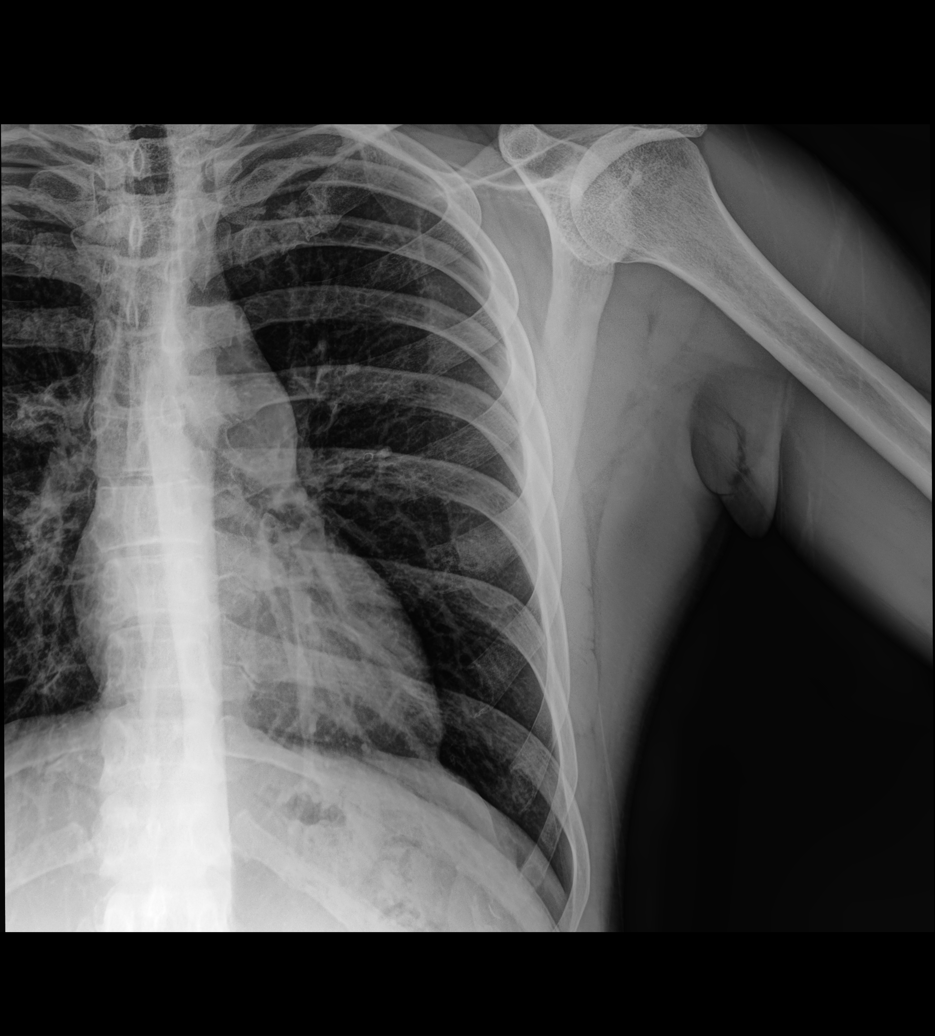

[ribs pa (2 of 4)]
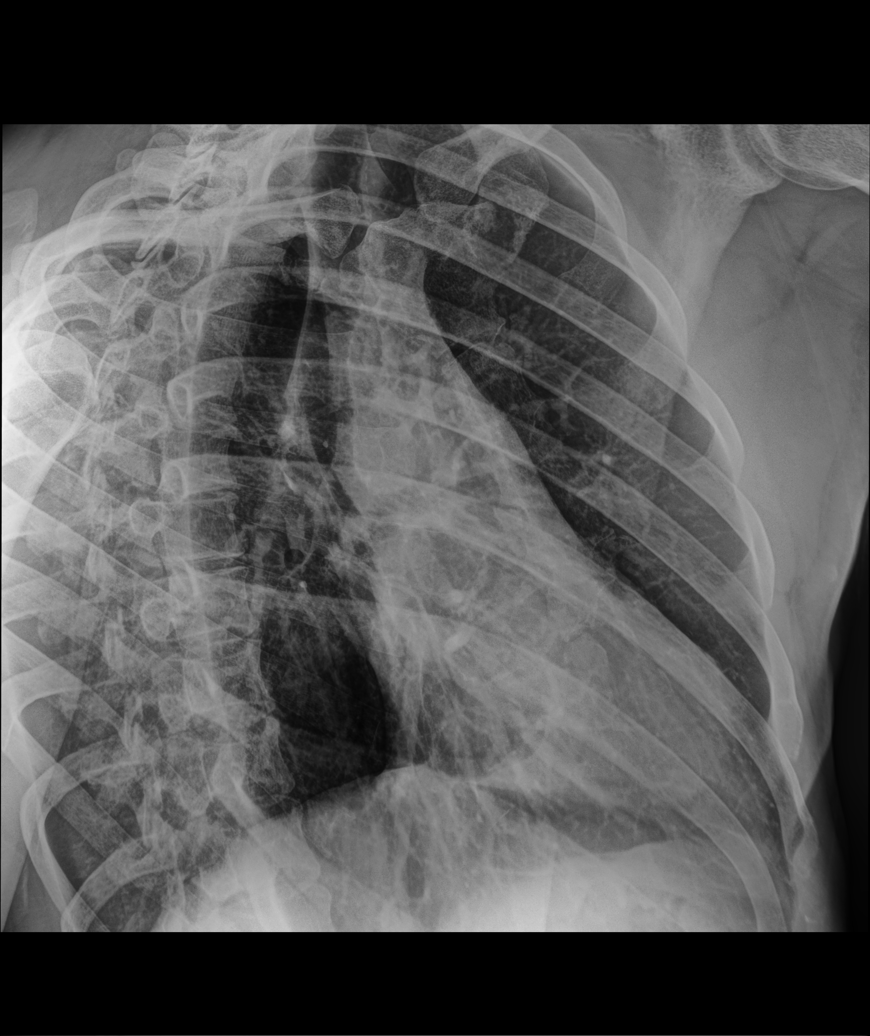

[ribs pa (3 of 4)]
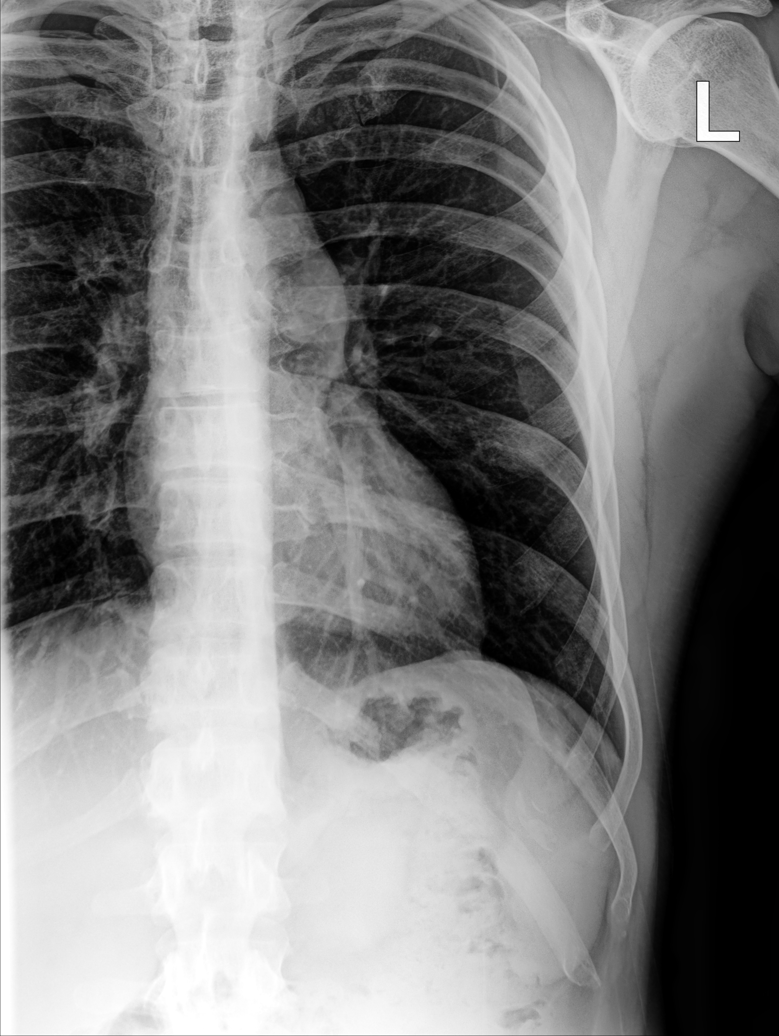

[ribs pa (4 of 4)]
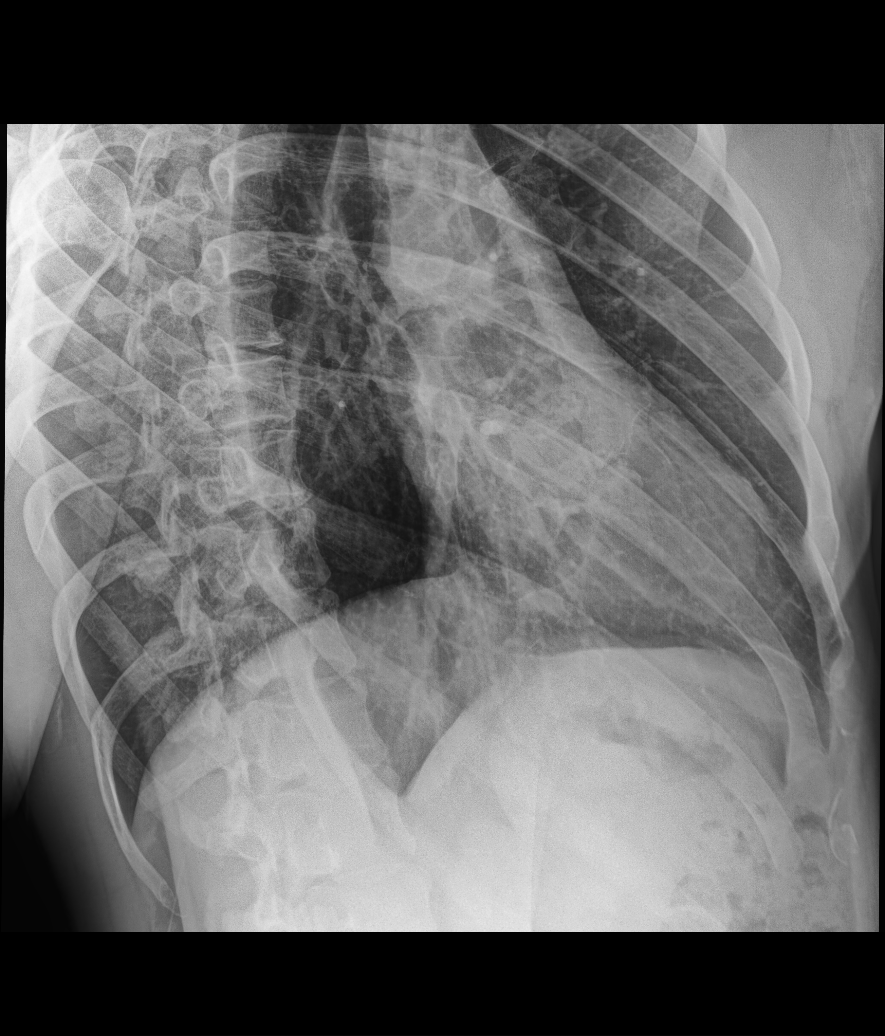

[5 of 5 positions shown; findings below may reference images not displayed]

FINDINGS: No fracture or other bone lesions are seen involving the ribs. There
is no evidence of pneumothorax or pleural effusion. Both lungs are
clear. Heart size and mediastinal contours are within normal limits.
IMPRESSION: Negative.
